# Patient Record
Sex: Female | Born: 1970 | Race: Black or African American | Hispanic: No | Marital: Single | Smoking: Never smoker
Health system: Southern US, Community
[De-identification: ages and names within clinical notes are randomized; demographics above are authoritative.]

---

## 2017-12-15 ENCOUNTER — Encounter: Payer: Self-pay | Admitting: Emergency Medicine

## 2017-12-15 ENCOUNTER — Emergency Department
Admission: EM | Admit: 2017-12-15 | Discharge: 2017-12-16 | Disposition: A | Discharge status: Home / Self Care | Payer: Self-pay | Attending: Emergency Medicine | Admitting: Emergency Medicine

## 2017-12-15 ENCOUNTER — Other Ambulatory Visit: Payer: Self-pay

## 2017-12-15 ENCOUNTER — Emergency Department: Payer: Self-pay

## 2017-12-15 DIAGNOSIS — Y92009 Unspecified place in unspecified non-institutional (private) residence as the place of occurrence of the external cause: Secondary | ICD-10-CM | POA: Insufficient documentation

## 2017-12-15 DIAGNOSIS — S0990XA Unspecified injury of head, initial encounter: Secondary | ICD-10-CM | POA: Insufficient documentation

## 2017-12-15 DIAGNOSIS — S060X0A Concussion without loss of consciousness, initial encounter: Secondary | ICD-10-CM | POA: Insufficient documentation

## 2017-12-15 DIAGNOSIS — Y999 Unspecified external cause status: Secondary | ICD-10-CM | POA: Insufficient documentation

## 2017-12-15 DIAGNOSIS — Y939 Activity, unspecified: Secondary | ICD-10-CM | POA: Insufficient documentation
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
## 2017-12-16 MED ORDER — HYDROCODONE-ACETAMINOPHEN 5-325 MG PO TABS: 1.0000 | ORAL_TABLET | Freq: Four times a day (QID) | ORAL | 0 refills | Status: AC | PRN

## 2017-12-16 MED ORDER — ONDANSETRON 4 MG PO TBDP
4.0000 mg | ORAL_TABLET | Freq: Once | ORAL | Status: AC
  Administered 2017-12-16: 4 mg via ORAL
  Filled 2017-12-16: qty 1

## 2017-12-16 MED ORDER — ONDANSETRON 4 MG PO TBDP: 4.0000 mg | ORAL_TABLET | Freq: Three times a day (TID) | ORAL | 0 refills | Status: AC | PRN

## 2017-12-16 MED ORDER — HYDROCODONE-ACETAMINOPHEN 5-325 MG PO TABS
1.0000 | ORAL_TABLET | Freq: Once | ORAL | Status: AC
  Administered 2017-12-16: 1 via ORAL
  Filled 2017-12-16: qty 1

## 2017-12-16 NOTE — Discharge Instructions (Addendum)
1.  You may take medicines as needed for pain and nausea (Norco/Zofran #15). 2.  Apply ice to affected area several times daily. 3.  Return to the ER for worsening symptoms, persistent vomiting, difficulty breathing or other concerns.
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
